# Patient Record
Sex: Female | Born: 1980 | Race: Black or African American | Hispanic: No | Marital: Single | Smoking: Never smoker
Health system: Southern US, Community
[De-identification: ages and names within clinical notes are randomized; demographics above are authoritative.]

## PROBLEM LIST (undated history)

## (undated) DIAGNOSIS — R519 Headache, unspecified: Secondary | ICD-10-CM

## (undated) DIAGNOSIS — Z8619 Personal history of other infectious and parasitic diseases: Secondary | ICD-10-CM

## (undated) DIAGNOSIS — A6000 Herpesviral infection of urogenital system, unspecified: Secondary | ICD-10-CM

## (undated) DIAGNOSIS — E221 Hyperprolactinemia: Secondary | ICD-10-CM

## (undated) DIAGNOSIS — R51 Headache: Secondary | ICD-10-CM

## (undated) HISTORY — DX: Herpesviral infection of urogenital system, unspecified: A60.00

## (undated) HISTORY — DX: Hyperprolactinemia: E22.1

## (undated) HISTORY — DX: Headache, unspecified: R51.9

## (undated) HISTORY — DX: Personal history of other infectious and parasitic diseases: Z86.19

## (undated) HISTORY — DX: Headache: R51

---
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
## 2002-02-05 ENCOUNTER — Other Ambulatory Visit: Admission: RE | Admit: 2002-02-05 | Discharge: 2002-02-05 | Payer: Self-pay | Admitting: Gynecology

## 2002-02-12 ENCOUNTER — Encounter: Payer: Self-pay | Admitting: Emergency Medicine

## 2002-02-12 ENCOUNTER — Emergency Department (HOSPITAL_COMMUNITY): Admission: EM | Admit: 2002-02-12 | Discharge: 2002-02-12 | Payer: Self-pay | Admitting: Emergency Medicine

## 2003-05-24 ENCOUNTER — Other Ambulatory Visit: Admission: RE | Admit: 2003-05-24 | Discharge: 2003-05-24 | Payer: Self-pay | Admitting: Internal Medicine

## 2003-08-17 ENCOUNTER — Encounter: Admission: RE | Admit: 2003-08-17 | Discharge: 2003-08-17 | Payer: Self-pay | Admitting: *Deleted

## 2003-09-14 ENCOUNTER — Encounter: Admission: RE | Admit: 2003-09-14 | Discharge: 2003-09-14 | Payer: Self-pay | Admitting: *Deleted

## 2003-09-14 ENCOUNTER — Ambulatory Visit (HOSPITAL_COMMUNITY): Admission: RE | Admit: 2003-09-14 | Discharge: 2003-09-14 | Payer: Self-pay | Admitting: *Deleted

## 2003-09-28 ENCOUNTER — Emergency Department (HOSPITAL_COMMUNITY): Admission: EM | Admit: 2003-09-28 | Discharge: 2003-09-28 | Payer: Self-pay | Admitting: Family Medicine

## 2003-10-12 ENCOUNTER — Encounter: Admission: RE | Admit: 2003-10-12 | Discharge: 2003-10-12 | Payer: Self-pay | Admitting: *Deleted

## 2003-11-09 ENCOUNTER — Ambulatory Visit: Payer: Self-pay | Admitting: *Deleted

## 2003-11-30 ENCOUNTER — Ambulatory Visit: Payer: Self-pay | Admitting: Family Medicine

## 2003-12-14 ENCOUNTER — Ambulatory Visit: Payer: Self-pay | Admitting: *Deleted

## 2003-12-28 ENCOUNTER — Ambulatory Visit: Payer: Self-pay | Admitting: Family Medicine

## 2004-01-11 ENCOUNTER — Ambulatory Visit: Payer: Self-pay | Admitting: *Deleted

## 2004-01-11 ENCOUNTER — Inpatient Hospital Stay (HOSPITAL_COMMUNITY): Admission: AD | Admit: 2004-01-11 | Discharge: 2004-01-11 | Payer: Self-pay | Admitting: Obstetrics & Gynecology

## 2004-01-14 ENCOUNTER — Ambulatory Visit: Payer: Self-pay | Admitting: *Deleted

## 2004-01-18 ENCOUNTER — Ambulatory Visit: Payer: Self-pay | Admitting: *Deleted

## 2004-01-18 ENCOUNTER — Ambulatory Visit (HOSPITAL_COMMUNITY): Admission: RE | Admit: 2004-01-18 | Discharge: 2004-01-18 | Payer: Self-pay | Admitting: *Deleted

## 2004-01-21 ENCOUNTER — Ambulatory Visit: Payer: Self-pay | Admitting: Obstetrics and Gynecology

## 2004-01-25 ENCOUNTER — Ambulatory Visit (HOSPITAL_COMMUNITY): Admission: RE | Admit: 2004-01-25 | Discharge: 2004-01-25 | Payer: Self-pay | Admitting: *Deleted

## 2004-01-25 ENCOUNTER — Ambulatory Visit: Payer: Self-pay | Admitting: *Deleted

## 2004-01-28 ENCOUNTER — Inpatient Hospital Stay (HOSPITAL_COMMUNITY): Admission: AD | Admit: 2004-01-28 | Discharge: 2004-01-28 | Payer: Self-pay | Admitting: *Deleted

## 2004-02-01 ENCOUNTER — Ambulatory Visit: Payer: Self-pay | Admitting: *Deleted

## 2004-02-04 ENCOUNTER — Ambulatory Visit: Payer: Self-pay | Admitting: *Deleted

## 2004-02-08 ENCOUNTER — Ambulatory Visit: Payer: Self-pay | Admitting: *Deleted

## 2004-02-11 ENCOUNTER — Ambulatory Visit: Payer: Self-pay | Admitting: Obstetrics & Gynecology

## 2004-02-15 ENCOUNTER — Inpatient Hospital Stay (HOSPITAL_COMMUNITY): Admission: AD | Admit: 2004-02-15 | Discharge: 2004-02-19 | Payer: Self-pay | Admitting: *Deleted

## 2004-02-15 ENCOUNTER — Ambulatory Visit: Payer: Self-pay | Admitting: *Deleted

## 2004-02-15 ENCOUNTER — Ambulatory Visit (HOSPITAL_COMMUNITY): Admission: RE | Admit: 2004-02-15 | Discharge: 2004-02-15 | Payer: Self-pay | Admitting: *Deleted

## 2004-02-17 ENCOUNTER — Ambulatory Visit: Payer: Self-pay | Admitting: *Deleted

## 2004-02-17 ENCOUNTER — Encounter (INDEPENDENT_AMBULATORY_CARE_PROVIDER_SITE_OTHER): Payer: Self-pay | Admitting: *Deleted

## 2005-10-26 ENCOUNTER — Emergency Department (HOSPITAL_COMMUNITY): Admission: EM | Admit: 2005-10-26 | Discharge: 2005-10-26 | Payer: Self-pay | Admitting: Family Medicine

## 2008-09-09 ENCOUNTER — Ambulatory Visit: Payer: Self-pay | Admitting: Gynecology

## 2008-09-09 ENCOUNTER — Other Ambulatory Visit: Admission: RE | Admit: 2008-09-09 | Discharge: 2008-09-09 | Payer: Self-pay | Admitting: Gynecology

## 2008-09-09 ENCOUNTER — Encounter: Payer: Self-pay | Admitting: Gynecology

## 2008-09-23 ENCOUNTER — Ambulatory Visit (HOSPITAL_COMMUNITY): Admission: RE | Admit: 2008-09-23 | Discharge: 2008-09-23 | Payer: Self-pay | Admitting: Gynecology

## 2008-10-13 ENCOUNTER — Ambulatory Visit: Payer: Self-pay | Admitting: Gynecology

## 2009-04-06 ENCOUNTER — Emergency Department (HOSPITAL_COMMUNITY): Admission: EM | Admit: 2009-04-06 | Discharge: 2009-04-07 | Payer: Self-pay | Admitting: Emergency Medicine

## 2009-04-08 ENCOUNTER — Ambulatory Visit: Payer: Self-pay | Admitting: Gynecology

## 2009-04-18 ENCOUNTER — Ambulatory Visit: Payer: Self-pay | Admitting: Gynecology

## 2010-03-26 ENCOUNTER — Encounter: Payer: Self-pay | Admitting: *Deleted

## 2010-05-24 LAB — COMPREHENSIVE METABOLIC PANEL
ALT: 12 U/L (ref 0–35)
AST: 20 U/L (ref 0–37)
Albumin: 3.4 g/dL — ABNORMAL LOW (ref 3.5–5.2)
Alkaline Phosphatase: 56 U/L (ref 39–117)
BUN: 5 mg/dL — ABNORMAL LOW (ref 6–23)
CO2: 27 mEq/L (ref 19–32)
Calcium: 8.7 mg/dL (ref 8.4–10.5)
Chloride: 100 mEq/L (ref 96–112)
Creatinine, Ser: 0.76 mg/dL (ref 0.4–1.2)
GFR calc Af Amer: >60 mL/min (ref >60)
GFR calc non Af Amer: >60 mL/min (ref >60)
Glucose, Bld: 114 mg/dL — ABNORMAL HIGH (ref 70–99)
Potassium: 3.6 mEq/L (ref 3.5–5.1)
Sodium: 134 mEq/L — ABNORMAL LOW (ref 135–145)
Total Bilirubin: 0.2 mg/dL — ABNORMAL LOW (ref 0.3–1.2)
Total Protein: 7.8 g/dL (ref 6.0–8.3)

## 2010-05-24 LAB — WET PREP, GENITAL
Clue Cells Wet Prep HPF POC: NONE SEEN
Trich, Wet Prep: NONE SEEN
Yeast Wet Prep HPF POC: NONE SEEN

## 2010-05-24 LAB — URINALYSIS, ROUTINE W REFLEX MICROSCOPIC
Bilirubin Urine: NEGATIVE
Glucose, UA: NEGATIVE mg/dL
Hgb urine dipstick: NEGATIVE
Ketones, ur: 15 mg/dL — AB
Nitrite: NEGATIVE
Protein, ur: 30 mg/dL — AB
Specific Gravity, Urine: 1.02 (ref 1.005–1.030)
Urobilinogen, UA: 1 mg/dL (ref 0.0–1.0)
pH: 7 (ref 5.0–8.0)

## 2010-05-24 LAB — CBC
HCT: 40 % (ref 36.0–46.0)
Hemoglobin: 13.2 g/dL (ref 12.0–15.0)
MCHC: 33 g/dL (ref 30.0–36.0)
MCV: 85 fL (ref 78.0–100.0)
Platelets: 222 10*3/uL (ref 150–400)
RBC: 4.71 MIL/uL (ref 3.87–5.11)
RDW: 13.9 % (ref 11.5–15.5)
WBC: 13.8 10*3/uL — ABNORMAL HIGH (ref 4.0–10.5)

## 2010-05-24 LAB — GC/CHLAMYDIA PROBE AMP, GENITAL
Chlamydia, DNA Probe: NEGATIVE
GC Probe Amp, Genital: NEGATIVE

## 2010-05-24 LAB — DIFFERENTIAL
Basophils Absolute: 0 10*3/uL (ref 0.0–0.1)
Basophils Relative: 0 % (ref 0–1)
Eosinophils Absolute: 0 10*3/uL (ref 0.0–0.7)
Eosinophils Relative: 0 % (ref 0–5)
Lymphocytes Relative: 4 % — ABNORMAL LOW (ref 12–46)
Lymphs Abs: 0.5 10*3/uL — ABNORMAL LOW (ref 0.7–4.0)
Monocytes Absolute: 0.7 10*3/uL (ref 0.1–1.0)
Monocytes Relative: 5 % (ref 3–12)
Neutro Abs: 12.5 10*3/uL — ABNORMAL HIGH (ref 1.7–7.7)
Neutrophils Relative %: 91 % — ABNORMAL HIGH (ref 43–77)

## 2010-05-24 LAB — URINE CULTURE
Colony Count: NO GROWTH
Culture: NO GROWTH

## 2010-05-24 LAB — URINE MICROSCOPIC-ADD ON

## 2010-05-24 LAB — MONONUCLEOSIS SCREEN: Mono Screen: NEGATIVE

## 2010-05-24 LAB — RAPID STREP SCREEN (MED CTR MEBANE ONLY): Streptococcus, Group A Screen (Direct): POSITIVE — AB

## 2010-05-24 LAB — LIPASE, BLOOD: Lipase: 36 U/L (ref 11–59)

## 2010-05-24 LAB — POCT PREGNANCY, URINE: Preg Test, Ur: NEGATIVE

## 2010-07-21 NOTE — Discharge Summary (Signed)
Katherine Pena, TOREN              ACCOUNT NO.:  [id number]   MEDICAL RECORD NO.:  [id number]          PATIENT TYPE:  INP   LOCATION:  9126                          FACILITY:  [facility]   PHYSICIAN:  Conni Elliot, M.D.DATE OF BIRTH:  1980/07/29   DATE OF ADMISSION:  02/15/2004  DATE OF DISCHARGE:                                 DISCHARGE SUMMARY   DISCHARGE DIAGNOSES:  1.  Term pregnancy, delivered.  2.  Induction of labor for oligohydramnios.  3.  Normal spontaneous vaginal delivery of a female infant.   DISCHARGE MEDICATIONS:  1.  Prenatal vitamins one p.o. daily.  2.  Ibuprofen 600 mg one p.o. q.6h. p.r.n. for pain.   HOSPITAL COURSE:  This 30 year old G1 P0 presented at [redacted] weeks gestational  age, sent for induction for oligohydramnios.  Ultrasound on February 15, 2004 revealed a vertex baby with a grade 3 placenta, AFI of 9.5, mean  gestational age approximately 36 weeks 1 day, best estimated fetal weight of  2784 g.  Fetal Dopplers were within normal limits.  The patient was admitted  for induction, given Cervidil for cervical ripening which was removed 12  hours later.  She was started on low-dose Pitocin, given penicillin for GBS  prophylaxis.  On day #3 of induction, the patient delivered a viable female  by normal spontaneous vaginal delivery with Apgars 9 at one minute and 9 at  five minutes under epidural anesthesia.  First degree laceration was  repaired with 3-0 Vicryl, estimated blood loss less than 500 mL.  The  patient and infant were stable.  The patient had a routine postpartum  course, is breastfeeding and bottle feeding, undecided on contraception but  will decide at her 6-week follow-up.   DISCHARGE LABORATORY DATA:  Blood type A positive, antibody negative.  GBS  positive.  Rubella immune.  Hematocrit 12.8.   FOLLOW-UP ITEMS:  Follow up at [facility] in 6 weeks for a postpartum  visit as well as contraception counseling.      TH/MEDQ  D:   02/19/2004  T:  02/19/2004  Job:  [id number]

## 2010-08-22 ENCOUNTER — Encounter: Payer: Self-pay | Admitting: Gynecology

## 2010-11-10 ENCOUNTER — Emergency Department (HOSPITAL_COMMUNITY)
Admission: EM | Admit: 2010-11-10 | Discharge: 2010-11-10 | Disposition: A | Discharge status: Home / Self Care | Payer: BC Managed Care – PPO | Attending: Emergency Medicine | Admitting: Emergency Medicine

## 2010-11-10 ENCOUNTER — Emergency Department (HOSPITAL_COMMUNITY): Payer: BC Managed Care – PPO

## 2010-11-10 DIAGNOSIS — M542 Cervicalgia: Secondary | ICD-10-CM | POA: Insufficient documentation

## 2010-11-10 DIAGNOSIS — G44209 Tension-type headache, unspecified, not intractable: Secondary | ICD-10-CM | POA: Insufficient documentation

## 2010-11-10 DIAGNOSIS — R209 Unspecified disturbances of skin sensation: Secondary | ICD-10-CM | POA: Insufficient documentation

## 2010-11-23 ENCOUNTER — Encounter: Payer: Self-pay | Admitting: Gynecology

## 2011-02-08 ENCOUNTER — Ambulatory Visit (INDEPENDENT_AMBULATORY_CARE_PROVIDER_SITE_OTHER): Payer: BC Managed Care – PPO | Admitting: Gynecology

## 2011-02-08 ENCOUNTER — Encounter: Payer: Self-pay | Admitting: Gynecology

## 2011-02-08 VITALS — BP 124/76 | Ht 65.5 in | Wt 160.0 lb

## 2011-02-08 DIAGNOSIS — N912 Amenorrhea, unspecified: Secondary | ICD-10-CM

## 2011-02-08 NOTE — Patient Instructions (Signed)
Establish care with an obstetrician in [place] for ongoing obstetrical care within the next 2-3 weeks.

## 2011-02-08 NOTE — Progress Notes (Signed)
Patient presents approximately 6 weeks with positive home pregnancy test. No bleeding no cramping or other complaints. She is on multivitamin with folate acid.  Exam which chaperone present Abdomen soft nontender without masses guarding rebound organomegaly Pelvic external BUS vagina normal, cervix normal, uterus 6 weeks size soft anteverted adnexa without masses or tenderness  Assessment and plan: IUP approximately [redacted] weeks pregnant. Patient understands that we do not do obstetrics and she will need to establish care with an obstetrician in town sometime over the next several weeks. I discussed the availability of prenatal diagnostic techniques and that she needs to establish early prenatal care. She'll continue on her multi-vitamin with folic acid.

## 2011-02-09 ENCOUNTER — Encounter: Payer: BC Managed Care – PPO | Admitting: Gynecology

## 2011-02-22 ENCOUNTER — Other Ambulatory Visit: Payer: Self-pay | Admitting: Obstetrics and Gynecology

## 2011-02-22 ENCOUNTER — Ambulatory Visit (INDEPENDENT_AMBULATORY_CARE_PROVIDER_SITE_OTHER): Payer: BC Managed Care – PPO | Admitting: *Deleted

## 2011-02-22 DIAGNOSIS — Z23 Encounter for immunization: Secondary | ICD-10-CM

## 2011-02-22 LAB — OB RESULTS CONSOLE RPR: RPR: NONREACTIVE

## 2011-02-22 LAB — OB RESULTS CONSOLE HEPATITIS B SURFACE ANTIGEN: Hepatitis B Surface Ag: NEGATIVE

## 2011-02-22 LAB — OB RESULTS CONSOLE GC/CHLAMYDIA: Chlamydia: NEGATIVE

## 2011-02-22 LAB — OB RESULTS CONSOLE HIV ANTIBODY (ROUTINE TESTING): HIV: NONREACTIVE

## 2011-02-22 LAB — OB RESULTS CONSOLE RUBELLA ANTIBODY, IGM: Rubella: IMMUNE

## 2011-03-06 NOTE — L&D Delivery Note (Signed)
Operative Delivery Note At 2:13 PM a viable and healthy female was delivered via Vaginal, Spontaneous Delivery.  Presentation: vertex; Position: Left,, Occiput,, Anterior; Station: +3.  Verbal consent: obtained from patient.  Risks and benefits discussed in detail.  Risks include, but are not limited to the risks of anesthesia, bleeding, infection, damage to maternal tissues, fetal cephalhematoma.  There is also the risk of inability to effect vaginal delivery of the head, or shoulder dystocia that cannot be resolved by established maneuvers, leading to the need for emergency cesarean section.  I was called for delivery. When I arrived in the room the patient was on her right side with oxygen in place.  Fetal heart rate was 80s and had been down for 5 minutes.  I advised the patient of the low heart rate and the need to expedite delivery with the assistance of the vacuum.  The vertex was at +2/+3 station.  The vacuum was applied and with one push the vertex was delivered to crowning.  The vacuum was disengaged and the body was delivered.  A nuchal cord was noted x 1.  The infant cried vigorously after delivery.  The placenta delivered spontaneously, intact with 3V cord.  A 2nd degree laceration was repaired with 3-0 vicryl.  Mother and baby doing well after delivery.  APGAR: , ; weight .   Placenta status: , .   Cord: 3 vessels with a nuchal cord x 1  Anesthesia: Epidural  Instruments: kiwi vaccuum Episiotomy: none  Lacerations: 2nd degree laceration Suture Repair: 3.0 vicryl Est. Blood Loss (mL): 350  Mom to postpartum.  Baby to nursery-stable.  Kayode Petion H. 10/08/2011, 2:31 PM

## 2011-10-05 ENCOUNTER — Telehealth (HOSPITAL_COMMUNITY): Payer: Self-pay | Admitting: *Deleted

## 2011-10-05 ENCOUNTER — Encounter (HOSPITAL_COMMUNITY): Payer: Self-pay | Admitting: *Deleted

## 2011-10-05 NOTE — Telephone Encounter (Signed)
Preadmission screen  

## 2011-10-07 ENCOUNTER — Inpatient Hospital Stay (HOSPITAL_COMMUNITY)
Admission: AD | Admit: 2011-10-07 | Discharge: 2011-10-10 | DRG: 373 | Disposition: A | Discharge status: Home / Self Care | Payer: BC Managed Care – PPO | Source: Ambulatory Visit | Attending: Obstetrics & Gynecology | Admitting: Obstetrics & Gynecology

## 2011-10-07 NOTE — MAU Note (Signed)
Contractions since 1700. Denies SROM or vaginal bleeding.

## 2011-10-08 ENCOUNTER — Encounter (HOSPITAL_COMMUNITY): Payer: Self-pay | Admitting: Anesthesiology

## 2011-10-08 ENCOUNTER — Encounter (HOSPITAL_COMMUNITY): Payer: Self-pay | Admitting: Family Medicine

## 2011-10-08 ENCOUNTER — Encounter (HOSPITAL_COMMUNITY): Payer: Self-pay | Admitting: *Deleted

## 2011-10-08 ENCOUNTER — Inpatient Hospital Stay (HOSPITAL_COMMUNITY): Payer: BC Managed Care – PPO | Admitting: Anesthesiology

## 2011-10-08 LAB — CBC
MCH: 24.4 pg — ABNORMAL LOW (ref 26.0–34.0)
MCHC: 32.3 g/dL (ref 30.0–36.0)
MCV: 75.4 fL — ABNORMAL LOW (ref 78.0–100.0)
Platelets: 210 10*3/uL (ref 150–400)
RBC: 4.84 MIL/uL (ref 3.87–5.11)
RDW: 16.7 % — ABNORMAL HIGH (ref 11.5–15.5)

## 2011-10-08 LAB — RPR: RPR Ser Ql: NONREACTIVE

## 2011-10-08 MED ORDER — ONDANSETRON HCL 4 MG/2ML IJ SOLN: 4.0000 mg | INTRAMUSCULAR | Status: DC | PRN

## 2011-10-08 MED ORDER — SENNOSIDES-DOCUSATE SODIUM 8.6-50 MG PO TABS
2.0000 | ORAL_TABLET | Freq: Every day | ORAL | Status: DC
  Administered 2011-10-09: 2 via ORAL

## 2011-10-08 MED ORDER — OXYCODONE-ACETAMINOPHEN 5-325 MG PO TABS: 1.0000 | ORAL_TABLET | ORAL | Status: DC | PRN

## 2011-10-08 MED ORDER — PHENYLEPHRINE 40 MCG/ML (10ML) SYRINGE FOR IV PUSH (FOR BLOOD PRESSURE SUPPORT)
80.0000 ug | PREFILLED_SYRINGE | INTRAVENOUS | Status: DC | PRN
  Filled 2011-10-08: qty 5

## 2011-10-08 MED ORDER — OXYTOCIN 40 UNITS IN LACTATED RINGERS INFUSION - SIMPLE MED
62.5000 mL/h | Freq: Once | INTRAVENOUS | Status: AC
  Administered 2011-10-08: 999 mL/h via INTRAVENOUS
  Filled 2011-10-08: qty 1000

## 2011-10-08 MED ORDER — EPHEDRINE 5 MG/ML INJ
10.0000 mg | INTRAVENOUS | Status: DC | PRN
  Filled 2011-10-08: qty 4

## 2011-10-08 MED ORDER — FENTANYL 2.5 MCG/ML BUPIVACAINE 1/10 % EPIDURAL INFUSION (WH - ANES)
14.0000 mL/h | INTRAMUSCULAR | Status: DC
  Administered 2011-10-08: 14 mL/h via EPIDURAL
  Filled 2011-10-08 (×3): qty 60

## 2011-10-08 MED ORDER — LACTATED RINGERS IV SOLN
INTRAVENOUS | Status: DC
  Administered 2011-10-08 (×4): via INTRAVENOUS

## 2011-10-08 MED ORDER — DIPHENHYDRAMINE HCL 25 MG PO CAPS: 25.0000 mg | ORAL_CAPSULE | Freq: Four times a day (QID) | ORAL | Status: DC | PRN

## 2011-10-08 MED ORDER — METHYLERGONOVINE MALEATE 0.2 MG PO TABS: 0.2000 mg | ORAL_TABLET | ORAL | Status: DC | PRN

## 2011-10-08 MED ORDER — IBUPROFEN 600 MG PO TABS: 600.0000 mg | ORAL_TABLET | Freq: Four times a day (QID) | ORAL | Status: DC | PRN

## 2011-10-08 MED ORDER — SIMETHICONE 80 MG PO CHEW: 80.0000 mg | CHEWABLE_TABLET | ORAL | Status: DC | PRN

## 2011-10-08 MED ORDER — PRENATAL MULTIVITAMIN CH
1.0000 | ORAL_TABLET | Freq: Every day | ORAL | Status: DC
  Administered 2011-10-09 – 2011-10-10 (×2): 1 via ORAL
  Filled 2011-10-08 (×2): qty 1

## 2011-10-08 MED ORDER — OXYTOCIN 40 UNITS IN LACTATED RINGERS INFUSION - SIMPLE MED
1.0000 m[IU]/min | INTRAVENOUS | Status: DC
  Administered 2011-10-08: 2 m[IU]/min via INTRAVENOUS

## 2011-10-08 MED ORDER — FENTANYL 2.5 MCG/ML BUPIVACAINE 1/10 % EPIDURAL INFUSION (WH - ANES)
INTRAMUSCULAR | Status: DC | PRN
  Administered 2011-10-08: 14 mL/h via EPIDURAL

## 2011-10-08 MED ORDER — TETANUS-DIPHTH-ACELL PERTUSSIS 5-2.5-18.5 LF-MCG/0.5 IM SUSP
0.5000 mL | Freq: Once | INTRAMUSCULAR | Status: AC
  Administered 2011-10-09: 0.5 mL via INTRAMUSCULAR
  Filled 2011-10-08 (×2): qty 0.5

## 2011-10-08 MED ORDER — OXYTOCIN BOLUS FROM INFUSION
250.0000 mL | Freq: Once | INTRAVENOUS | Status: DC
  Filled 2011-10-08: qty 500

## 2011-10-08 MED ORDER — FLEET ENEMA 7-19 GM/118ML RE ENEM: 1.0000 | ENEMA | RECTAL | Status: DC | PRN

## 2011-10-08 MED ORDER — LIDOCAINE HCL (PF) 1 % IJ SOLN
INTRAMUSCULAR | Status: DC | PRN
  Administered 2011-10-08 (×2): 4 mL

## 2011-10-08 MED ORDER — ZOLPIDEM TARTRATE 5 MG PO TABS: 5.0000 mg | ORAL_TABLET | Freq: Every evening | ORAL | Status: DC | PRN

## 2011-10-08 MED ORDER — WITCH HAZEL-GLYCERIN EX PADS
1.0000 "application " | MEDICATED_PAD | CUTANEOUS | Status: DC | PRN
  Administered 2011-10-09: 1 via TOPICAL

## 2011-10-08 MED ORDER — IBUPROFEN 600 MG PO TABS
600.0000 mg | ORAL_TABLET | Freq: Four times a day (QID) | ORAL | Status: DC
  Administered 2011-10-08 – 2011-10-10 (×7): 600 mg via ORAL
  Filled 2011-10-08 (×8): qty 1

## 2011-10-08 MED ORDER — DIBUCAINE 1 % RE OINT
1.0000 "application " | TOPICAL_OINTMENT | RECTAL | Status: DC | PRN
  Administered 2011-10-09: 1 via RECTAL
  Filled 2011-10-08 (×2): qty 28

## 2011-10-08 MED ORDER — TERBUTALINE SULFATE 1 MG/ML IJ SOLN: 0.2500 mg | Freq: Once | INTRAMUSCULAR | Status: DC | PRN

## 2011-10-08 MED ORDER — BUTORPHANOL TARTRATE 1 MG/ML IJ SOLN
1.0000 mg | INTRAMUSCULAR | Status: DC | PRN
  Administered 2011-10-08 (×4): 1 mg via INTRAVENOUS
  Filled 2011-10-08 (×4): qty 1

## 2011-10-08 MED ORDER — LANOLIN HYDROUS EX OINT: TOPICAL_OINTMENT | CUTANEOUS | Status: DC | PRN

## 2011-10-08 MED ORDER — ONDANSETRON HCL 4 MG PO TABS: 4.0000 mg | ORAL_TABLET | ORAL | Status: DC | PRN

## 2011-10-08 MED ORDER — LACTATED RINGERS IV SOLN: 500.0000 mL | Freq: Once | INTRAVENOUS | Status: DC

## 2011-10-08 MED ORDER — EPHEDRINE 5 MG/ML INJ: 10.0000 mg | INTRAVENOUS | Status: DC | PRN

## 2011-10-08 MED ORDER — PHENYLEPHRINE 40 MCG/ML (10ML) SYRINGE FOR IV PUSH (FOR BLOOD PRESSURE SUPPORT): 80.0000 ug | PREFILLED_SYRINGE | INTRAVENOUS | Status: DC | PRN

## 2011-10-08 MED ORDER — ACETAMINOPHEN 325 MG PO TABS: 650.0000 mg | ORAL_TABLET | ORAL | Status: DC | PRN

## 2011-10-08 MED ORDER — ONDANSETRON HCL 4 MG/2ML IJ SOLN: 4.0000 mg | Freq: Four times a day (QID) | INTRAMUSCULAR | Status: DC | PRN

## 2011-10-08 MED ORDER — BENZOCAINE-MENTHOL 20-0.5 % EX AERO
1.0000 "application " | INHALATION_SPRAY | CUTANEOUS | Status: DC | PRN
  Administered 2011-10-08: 1 via TOPICAL
  Filled 2011-10-08 (×2): qty 56

## 2011-10-08 MED ORDER — LACTATED RINGERS IV SOLN: 500.0000 mL | INTRAVENOUS | Status: DC | PRN

## 2011-10-08 MED ORDER — CITRIC ACID-SODIUM CITRATE 334-500 MG/5ML PO SOLN: 30.0000 mL | ORAL | Status: DC | PRN

## 2011-10-08 MED ORDER — DIPHENHYDRAMINE HCL 50 MG/ML IJ SOLN: 12.5000 mg | INTRAMUSCULAR | Status: DC | PRN

## 2011-10-08 MED ORDER — METHYLERGONOVINE MALEATE 0.2 MG/ML IJ SOLN: 0.2000 mg | INTRAMUSCULAR | Status: DC | PRN

## 2011-10-08 MED ORDER — LIDOCAINE HCL (PF) 1 % IJ SOLN
30.0000 mL | INTRAMUSCULAR | Status: DC | PRN
  Filled 2011-10-08: qty 30

## 2011-10-08 MED ORDER — OXYCODONE-ACETAMINOPHEN 5-325 MG PO TABS
1.0000 | ORAL_TABLET | ORAL | Status: DC | PRN
  Administered 2011-10-08: 1 via ORAL
  Filled 2011-10-08: qty 1

## 2011-10-08 NOTE — Anesthesia Procedure Notes (Signed)
Epidural Patient location during procedure: OB Start time: 10/08/2011 9:11 AM  Staffing Anesthesiologist: Kristapher [place] A. Performed by: anesthesiologist   Preanesthetic Checklist Completed: patient identified, site marked, surgical consent, pre-op evaluation, timeout performed, IV checked, risks and benefits discussed and monitors and equipment checked  Epidural Patient position: sitting Prep: site prepped and draped and DuraPrep Patient monitoring: continuous pulse ox and blood pressure Approach: midline Injection technique: LOR air  Needle:  Needle type: Tuohy  Needle gauge: 17 G Needle length: 9 cm Needle insertion depth: 5 cm cm Catheter type: closed end flexible Catheter size: 19 Gauge Catheter at skin depth: 10 cm Test dose: negative and Other  Assessment Events: blood not aspirated, injection not painful, no injection resistance, negative IV test and no paresthesia  Additional Notes Patient identified. Risks and benefits discussed including failed block, incomplete  Pain control, post dural puncture headache, nerve damage, paralysis, blood pressure Changes, nausea, vomiting, reactions to medications-both toxic and allergic and post Partum back pain. All questions were answered. Patient expressed understanding and wished to proceed. Sterile technique was used throughout procedure. Epidural site was Dressed with sterile barrier dressing. No paresthesias, signs of intravascular injection Or signs of intrathecal spread were encountered.  Patient was more comfortable after the epidural was dosed. Please see RN's note for documentation of vital signs and FHR which are stable.

## 2011-10-08 NOTE — Anesthesia Preprocedure Evaluation (Signed)
Anesthesia Evaluation  Patient identified by MRN, date of birth, ID band Patient awake    Reviewed: Allergy & Precautions, H&P , Patient's Chart, lab work & pertinent test results  Airway Mallampati: III TM Distance: >3 FB Neck ROM: full    Dental No notable dental hx. (+) Teeth Intact   Pulmonary neg pulmonary ROS,  breath sounds clear to auscultation  Pulmonary exam normal       Cardiovascular negative cardio ROS  Rhythm:regular Rate:Normal     Neuro/Psych negative neurological ROS  negative psych ROS   GI/Hepatic negative GI ROS, Neg liver ROS,   Endo/Other  negative endocrine ROS  Renal/GU negative Renal ROS  negative genitourinary   Musculoskeletal   Abdominal Normal abdominal exam  (+)   Peds  Hematology  (+) [facility]  Anesthesia Other Findings   Reproductive/Obstetrics (+) Pregnancy                           Anesthesia Physical Anesthesia Plan  ASA: II  Anesthesia Plan: Epidural   Post-op Pain Management:    Induction:   Airway Management Planned:   Additional Equipment:   Intra-op Plan:   Post-operative Plan:   Informed Consent: I have reviewed the patients History and Physical, chart, labs and discussed the procedure including the risks, benefits and alternatives for the proposed anesthesia with the patient or authorized representative who has indicated his/her understanding and acceptance.     Plan Discussed with: Anesthesiologist  Anesthesia Plan Comments:         Anesthesia Quick Evaluation

## 2011-10-08 NOTE — H&P (Signed)
Katherine Pena is a 31 y.o. female presenting for labor Patients pregnancy has been uncomplicated up to this point.  She presents to MAU for painful contractions. 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 History OB History    Grav Para Term Preterm Abortions TAB SAB Ect Mult Living   2 1 1   0 0    1     Past Medical History  Diagnosis Date  . H/O varicella    History reviewed. No pertinent past surgical history. Family History: family history is negative for Other. Social History:  reports that she has never smoked. She has never used smokeless tobacco. She reports that she does not drink alcohol or use illicit drugs.   Prenatal Transfer Tool  Maternal Diabetes: No Genetic Screening: Declined Maternal Ultrasounds/Referrals: Normal Fetal Ultrasounds or other Referrals:  None Maternal Substance Abuse:  No Significant Maternal Medications:  None Significant Maternal Lab Results:  None Other Comments:  None  ROS: as above  Dilation: 7.5 Effacement (%): 80 Station: -1 Exam by:: Clearence Cheek RN Blood pressure 106/62, pulse 101, temperature 98.7 F (37.1 C), temperature source Oral, resp. rate 20, height 5\' 3"  (1.6 m), weight 77.565 kg (171 lb), last menstrual period 12/28/2010, SpO2 99.00%. Exam Physical Exam   AOX3, uncomfortable Gravid soft FHT 150 reactive with accels toco Q2-5 minutes with coupling  Prenatal labs: ABO, Rh: A/Positive/-- (12/20 0000) Antibody: Negative (12/20 0000) Rubella: Immune (12/20 0000) RPR: Nonreactive (12/20 0000)  HBsAg: Negative (12/20 0000)  HIV: Non-reactive (12/20 0000)  GBS: Negative (07/10 0000)   Assessment/Plan: 31 yo G2P1001 at 40+4 in labor 1) admit 2) FWB reassuring 3) Cervix has been 6-7 for several hours.  The patient was originally refusing epidural and only wanted IV pain management. Now stadol not relieving pain.  Discussed lack of cervical change and need for augmentation.  Discussed pain management  options and encouraged patient to consider epidural.  After careful consideration the patient wishes to proceed with epidural.  Katherine Pena H. 10/08/2011, 9:37 AM

## 2011-10-08 NOTE — H&P (Signed)
31 y.o. G2P1001  Estimated Date of Delivery: 10/04/11 admitted at 40/[redacted] wks gestation in labor.  Prenatal Transfer Tool  Maternal Diabetes: No Genetic Screening: Declined Maternal Ultrasounds/Referrals: Normal Fetal Ultrasounds or other Referrals:  None Maternal Substance Abuse:  No Significant Maternal Medications:  None Significant Maternal Lab Results: None Other Significant Pregnancy Complications:  none  Afebrile, VSS Heart and Lungs: No active disease Abdomen: soft, gravid, EFW AGA. Cervical exam: 6/70  Impression: Labor  Plan:  Admit

## 2011-10-09 LAB — CBC
HCT: 30.7 % — ABNORMAL LOW (ref 36.0–46.0)
Hemoglobin: 9.8 g/dL — ABNORMAL LOW (ref 12.0–15.0)
MCH: 24.3 pg — ABNORMAL LOW (ref 26.0–34.0)
MCV: 76.2 fL — ABNORMAL LOW (ref 78.0–100.0)
Platelets: 189 10*3/uL (ref 150–400)
RBC: 4.03 MIL/uL (ref 3.87–5.11)

## 2011-10-09 NOTE — Progress Notes (Signed)
PPD#1  Pt without c/o. Plans to have circ in office. VSSAF IMP/doing well. Plan/ routine care.

## 2011-10-09 NOTE — Anesthesia Postprocedure Evaluation (Signed)
  Anesthesia Post-op Note  Patient: Katherine Pena  Procedure(s) Performed: * No procedures listed *  Patient Location: Mother/Baby  Anesthesia Type: Epidural  Level of Consciousness: awake  Airway and Oxygen Therapy: Patient Spontanous Breathing  Post-op Pain: none  Post-op Assessment: Patient's Cardiovascular Status Stable, Respiratory Function Stable, Patent Airway, No signs of Nausea or vomiting, Adequate PO intake, Pain level controlled, No headache, No backache, No residual numbness and No residual motor weakness  Post-op Vital Signs: Reviewed and stable  Complications: No apparent anesthesia complications

## 2011-10-10 NOTE — Progress Notes (Signed)
Post discharge review completed. 

## 2011-10-10 NOTE — Discharge Summary (Signed)
Obstetric Discharge Summary Reason for Admission: onset of labor Prenatal Procedures: none Intrapartum Procedures: vacuum Postpartum Procedures: none Complications-Operative and Postpartum: 2 degree perineal laceration Hemoglobin  Date Value Range Status  10/09/2011 9.8* 12.0 - 15.0 g/dL Final     DELTA CHECK NOTED     REPEATED TO VERIFY     HCT  Date Value Range Status  10/09/2011 30.7* 36.0 - 46.0 % Final    Discharge Diagnoses: Term Pregnancy-delivered  Discharge Information: Date: 10/10/2011 Activity: pelvic rest Diet: routine Medications: Ibuprofen and Iron Condition: stable Instructions: refer to practice specific booklet Discharge to: home Follow-up Information    Follow up with Almon Hercules., MD. Schedule an appointment as soon as possible for a visit in 4 weeks.   Contact information:   [address] [place] [address] [phone]          Newborn Data: Live born female  Birth Weight: 8 lb 0.2 oz (3634 g) APGAR: 9, 9  Home with mother.  Katherine Pena A 10/10/2011, 8:37 AM

## 2011-10-10 NOTE — Progress Notes (Signed)
Patient is eating, ambulating, voiding.  Pain control is good.  Filed Vitals:   10/08/11 2145 10/09/11 0545 10/09/11 2135 10/10/11 0539  BP: 116/72 99/65 102/66 118/79  Pulse: 88 91 87 96  Temp: 98.2 F (36.8 C) 97.7 F (36.5 C) 98.1 F (36.7 C) 98.4 F (36.9 C)  TempSrc:  Oral Oral Oral  Resp: 20 18 18 18   Height:      Weight:      SpO2:        Fundus firm Perineum without swelling.  Lab Results  Component Value Date   WBC 20.8* 10/09/2011   HGB 9.8* 10/09/2011   HCT 30.7* 10/09/2011   MCV 76.2* 10/09/2011   PLT 189 10/09/2011    A/Positive/-- (12/20 0000)/RI  A/P Post partum day 2.  Routine care.  Expect d/c today.    Chikita Dogan A

## 2011-10-12 ENCOUNTER — Inpatient Hospital Stay (HOSPITAL_COMMUNITY): Admission: RE | Admit: 2011-10-12 | Payer: BC Managed Care – PPO | Source: Ambulatory Visit

## 2014-01-04 ENCOUNTER — Encounter (HOSPITAL_COMMUNITY): Payer: Self-pay | Admitting: *Deleted

## 2014-03-05 DIAGNOSIS — A6000 Herpesviral infection of urogenital system, unspecified: Secondary | ICD-10-CM

## 2014-03-05 HISTORY — DX: Herpesviral infection of urogenital system, unspecified: A60.00

## 2014-04-23 ENCOUNTER — Other Ambulatory Visit (HOSPITAL_COMMUNITY)
Admission: RE | Admit: 2014-04-23 | Discharge: 2014-04-23 | Disposition: A | Discharge status: Home / Self Care | Payer: BC Managed Care – PPO | Source: Ambulatory Visit | Attending: Gynecology | Admitting: Gynecology

## 2014-04-23 ENCOUNTER — Encounter: Payer: Self-pay | Admitting: Gynecology

## 2014-04-23 ENCOUNTER — Ambulatory Visit (INDEPENDENT_AMBULATORY_CARE_PROVIDER_SITE_OTHER): Payer: BC Managed Care – PPO | Admitting: Gynecology

## 2014-04-23 VITALS — BP 124/80 | Ht 66.0 in | Wt 187.0 lb

## 2014-04-23 DIAGNOSIS — Z01419 Encounter for gynecological examination (general) (routine) without abnormal findings: Secondary | ICD-10-CM | POA: Insufficient documentation

## 2014-04-23 DIAGNOSIS — Z1151 Encounter for screening for human papillomavirus (HPV): Secondary | ICD-10-CM | POA: Diagnosis present

## 2014-04-23 DIAGNOSIS — E221 Hyperprolactinemia: Secondary | ICD-10-CM

## 2014-04-23 DIAGNOSIS — D352 Benign neoplasm of pituitary gland: Secondary | ICD-10-CM

## 2014-04-23 DIAGNOSIS — Z3009 Encounter for other general counseling and advice on contraception: Secondary | ICD-10-CM

## 2014-04-23 LAB — CBC WITH DIFFERENTIAL/PLATELET
BASOS ABS: 0 10*3/uL (ref 0.0–0.1)
Basophils Relative: 0 % (ref 0–1)
EOS ABS: 0 10*3/uL (ref 0.0–0.7)
Eosinophils Relative: 0 % (ref 0–5)
HEMATOCRIT: 37.4 % (ref 36.0–46.0)
Hemoglobin: 11.4 g/dL — ABNORMAL LOW (ref 12.0–15.0)
Lymphocytes Relative: 24 % (ref 12–46)
Lymphs Abs: 2.1 10*3/uL (ref 0.7–4.0)
MCH: 24.2 pg — ABNORMAL LOW (ref 26.0–34.0)
MCHC: 30.5 g/dL (ref 30.0–36.0)
MCV: 79.4 fL (ref 78.0–100.0)
MONO ABS: 0.6 10*3/uL (ref 0.1–1.0)
MONOS PCT: 7 % (ref 3–12)
MPV: 10.4 fL (ref 8.6–12.4)
NEUTROS ABS: 5.9 10*3/uL (ref 1.7–7.7)
NEUTROS PCT: 69 % (ref 43–77)
PLATELETS: 334 10*3/uL (ref 150–400)
RBC: 4.71 MIL/uL (ref 3.87–5.11)
RDW: 15.7 % — AB (ref 11.5–15.5)
WBC: 8.6 10*3/uL (ref 4.0–10.5)

## 2014-04-23 LAB — LIPID PANEL
CHOL/HDL RATIO: 3.2 ratio
Cholesterol: 145 mg/dL (ref 0–200)
HDL: 45 mg/dL (ref >39)
LDL CALC: 90 mg/dL (ref 0–99)
Triglycerides: 49 mg/dL (ref <150)
VLDL: 10 mg/dL (ref 0–40)

## 2014-04-23 LAB — COMPREHENSIVE METABOLIC PANEL
ALT: 10 U/L (ref 0–35)
AST: 12 U/L (ref 0–37)
Albumin: 4.1 g/dL (ref 3.5–5.2)
Alkaline Phosphatase: 73 U/L (ref 39–117)
BUN: 9 mg/dL (ref 6–23)
CALCIUM: 9.6 mg/dL (ref 8.4–10.5)
CHLORIDE: 103 meq/L (ref 96–112)
CO2: 26 meq/L (ref 19–32)
CREATININE: 0.67 mg/dL (ref 0.50–1.10)
GLUCOSE: 110 mg/dL — AB (ref 70–99)
Potassium: 3.7 mEq/L (ref 3.5–5.3)
Sodium: 138 mEq/L (ref 135–145)
Total Bilirubin: 0.5 mg/dL (ref 0.2–1.2)
Total Protein: 7.8 g/dL (ref 6.0–8.3)

## 2014-04-23 NOTE — Addendum Note (Signed)
Addended by: Nelva Nay on: 04/23/2014 12:46 PM   Modules accepted: Orders

## 2014-04-23 NOTE — Patient Instructions (Signed)
Office will call you to arrange the MRI of the pituitary.  Follow up with your decision as far as contraception.  You may obtain a copy of any labs that were done today by logging onto MyChart as outlined in the instructions provided with your AVS (after visit summary). The office will not call with normal lab results but certainly if there are any significant abnormalities then we will contact you.   Health Maintenance, Female A healthy lifestyle and preventative care can promote health and wellness.  Maintain regular health, dental, and eye exams.  Eat a healthy diet. Foods like vegetables, fruits, whole grains, low-fat dairy products, and lean protein foods contain the nutrients you need without too many calories. Decrease your intake of foods high in solid fats, added sugars, and salt. Get information about a proper diet from your caregiver, if necessary.  Regular physical exercise is one of the most important things you can do for your health. Most adults should get at least 150 minutes of moderate-intensity exercise (any activity that increases your heart rate and causes you to sweat) each week. In addition, most adults need muscle-strengthening exercises on 2 or more days a week.   Maintain a healthy weight. The body mass index (BMI) is a screening tool to identify possible weight problems. It provides an estimate of body fat based on height and weight. Your caregiver can help determine your BMI, and can help you achieve or maintain a healthy weight. For adults 20 years and older:  A BMI below 18.5 is considered underweight.  A BMI of 18.5 to 24.9 is normal.  A BMI of 25 to 29.9 is considered overweight.  A BMI of 30 and above is considered obese.  Maintain normal blood lipids and cholesterol by exercising and minimizing your intake of saturated fat. Eat a balanced diet with plenty of fruits and vegetables. Blood tests for lipids and cholesterol should begin at age 75 and be repeated  every 5 years. If your lipid or cholesterol levels are high, you are over 50, or you are a high risk for heart disease, you may need your cholesterol levels checked more frequently.Ongoing high lipid and cholesterol levels should be treated with medicines if diet and exercise are not effective.  If you smoke, find out from your caregiver how to quit. If you do not use tobacco, do not start.  Lung cancer screening is recommended for adults aged 96 80 years who are at high risk for developing lung cancer because of a history of smoking. Yearly low-dose computed tomography (CT) is recommended for people who have at least a 30-pack-year history of smoking and are a current smoker or have quit within the past 15 years. A pack year of smoking is smoking an average of 1 pack of cigarettes a day for 1 year (for example: 1 pack a day for 30 years or 2 packs a day for 15 years). Yearly screening should continue until the smoker has stopped smoking for at least 15 years. Yearly screening should also be stopped for people who develop a health problem that would prevent them from having lung cancer treatment.  If you are pregnant, do not drink alcohol. If you are breastfeeding, be very cautious about drinking alcohol. If you are not pregnant and choose to drink alcohol, do not exceed 1 drink per day. One drink is considered to be 12 ounces (355 mL) of beer, 5 ounces (148 mL) of wine, or 1.5 ounces (44 mL) of liquor.  Avoid use of street drugs. Do not share needles with anyone. Ask for help if you need support or instructions about stopping the use of drugs.  High blood pressure causes heart disease and increases the risk of stroke. Blood pressure should be checked at least every 1 to 2 years. Ongoing high blood pressure should be treated with medicines, if weight loss and exercise are not effective.  If you are 44 to 35 years old, ask your caregiver if you should take aspirin to prevent strokes.  Diabetes  screening involves taking a blood sample to check your fasting blood sugar level. This should be done once every 3 years, after age 54, if you are within normal weight and without risk factors for diabetes. Testing should be considered at a younger age or be carried out more frequently if you are overweight and have at least 1 risk factor for diabetes.  Breast cancer screening is essential preventative care for women. You should practice "breast self-awareness." This means understanding the normal appearance and feel of your breasts and may include breast self-examination. Any changes detected, no matter how small, should be reported to a caregiver. Women in their 31s and 30s should have a clinical breast exam (CBE) by a caregiver as part of a regular health exam every 1 to 3 years. After age 12, women should have a CBE every year. Starting at age 70, women should consider having a mammogram (breast X-ray) every year. Women who have a family history of breast cancer should talk to their caregiver about genetic screening. Women at a high risk of breast cancer should talk to their caregiver about having an MRI and a mammogram every year.  Breast cancer gene (BRCA)-related cancer risk assessment is recommended for women who have family members with BRCA-related cancers. BRCA-related cancers include breast, ovarian, tubal, and peritoneal cancers. Having family members with these cancers may be associated with an increased risk for harmful changes (mutations) in the breast cancer genes BRCA1 and BRCA2. Results of the assessment will determine the need for genetic counseling and BRCA1 and BRCA2 testing.  The Pap test is a screening test for cervical cancer. Women should have a Pap test starting at age 35. Between ages 63 and 55, Pap tests should be repeated every 2 years. Beginning at age 24, you should have a Pap test every 3 years as long as the past 3 Pap tests have been normal. If you had a hysterectomy for a  problem that was not cancer or a condition that could lead to cancer, then you no longer need Pap tests. If you are between ages 62 and 39, and you have had normal Pap tests going back 10 years, you no longer need Pap tests. If you have had past treatment for cervical cancer or a condition that could lead to cancer, you need Pap tests and screening for cancer for at least 20 years after your treatment. If Pap tests have been discontinued, risk factors (such as a new sexual partner) need to be reassessed to determine if screening should be resumed. Some women have medical problems that increase the chance of getting cervical cancer. In these cases, your caregiver may recommend more frequent screening and Pap tests.  The human papillomavirus (HPV) test is an additional test that may be used for cervical cancer screening. The HPV test looks for the virus that can cause the cell changes on the cervix. The cells collected during the Pap test can be tested for HPV. The  HPV test could be used to screen women aged 30 years and older, and should be used in women of any age who have unclear Pap test results. After the age of 63, women should have HPV testing at the same frequency as a Pap test.  Colorectal cancer can be detected and often prevented. Most routine colorectal cancer screening begins at the age of 54 and continues through age 62. However, your caregiver may recommend screening at an earlier age if you have risk factors for colon cancer. On a yearly basis, your caregiver may provide home test kits to check for hidden blood in the stool. Use of a small camera at the end of a tube, to directly examine the colon (sigmoidoscopy or colonoscopy), can detect the earliest forms of colorectal cancer. Talk to your caregiver about this at age 31, when routine screening begins. Direct examination of the colon should be repeated every 5 to 10 years through age 42, unless early forms of pre-cancerous polyps or small growths  are found.  Hepatitis C blood testing is recommended for all people born from [birth year] through 1966 and any individual with known risks for hepatitis C.  Practice safe sex. Use condoms and avoid high-risk sexual practices to reduce the spread of sexually transmitted infections (STIs). Sexually active women aged 4 and younger should be checked for Chlamydia, which is a common sexually transmitted infection. Older women with new or multiple partners should also be tested for Chlamydia. Testing for other STIs is recommended if you are sexually active and at increased risk.  Osteoporosis is a disease in which the bones lose minerals and strength with aging. This can result in serious bone fractures. The risk of osteoporosis can be identified using a bone density scan. Women ages 28 and over and women at risk for fractures or osteoporosis should discuss screening with their caregivers. Ask your caregiver whether you should be taking a calcium supplement or vitamin D to reduce the rate of osteoporosis.  Menopause can be associated with physical symptoms and risks. Hormone replacement therapy is available to decrease symptoms and risks. You should talk to your caregiver about whether hormone replacement therapy is right for you.  Use sunscreen. Apply sunscreen liberally and repeatedly throughout the day. You should seek shade when your shadow is shorter than you. Protect yourself by wearing long sleeves, pants, a wide-brimmed hat, and sunglasses year round, whenever you are outdoors.  Notify your caregiver of new moles or changes in moles, especially if there is a change in shape or color. Also notify your caregiver if a mole is larger than the size of a pencil eraser.  Stay current with your immunizations. Document Released: 09/04/2010 Document Revised: 06/16/2012 Document Reviewed: 09/04/2010 [facility] Patient Information 2014 [place].

## 2014-04-23 NOTE — Progress Notes (Signed)
Katherine Pena 04-10-1980 [id number]        33 y.o.  G2P2002 for annual exam.  Has not been in the office since 2011. Reports doing well with several issues noted below.  Past medical history,surgical history, problem list, medications, allergies, family history and social history were all reviewed and documented as reviewed in the EPIC chart.  ROS:  Performed with pertinent positives and negatives included in the history, assessment and plan.   Additional significant findings :  none   Exam: Kim Counsellor Vitals:   04/23/14 1208  BP: 124/80  Height: 5\' 6"  (1.676 m)  Weight: 187 lb (84.823 kg)   General appearance:  Normal affect, orientation and appearance. Skin: Grossly normal HEENT: Without gross lesions.  No cervical or supraclavicular adenopathy. Thyroid normal.  Lungs:  Clear without wheezing, rales or rhonchi Cardiac: RR, without RMG Abdominal:  Soft, nontender, without masses, guarding, rebound, organomegaly or hernia Breasts:  Examined lying and sitting without masses, retractions, discharge or axillary adenopathy. Pelvic:  Ext/BUS/vagina normal  Cervix normal. Pap/HPV  Uterus anteverted, normal size, shape and contour, midline and mobile nontender   Adnexa  Without masses or tenderness    Anus and perineum  Normal   Rectovaginal  Normal sphincter tone without palpated masses or tenderness.    Assessment/Plan:  34 y.o. G24P2002 female for annual exam with regular menses, condom contraception.   1. Contraception. Patient wants to discuss contraceptive options. Reviewed pill, patch, ring, Nexplanon, Depo-Provera, IUD, sterilization. Patient leaning toward sterilization. Would also consider IUD. I reviewed the Mirena IUD, risks/benefits. Insertional process discussed. Issues with sterilization to include general anesthesia and abdominal incisions/surgery reviewed. Patient wants to think of her options and will follow up with her decision. 2. 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 Last evaluated 2010.  Check prolactin today. Schedule follow up MRI as it has been 5-6 years. No history of galactorrhea headaches or visual changes. Patient knows importance of follow up for the MRI. 3. Pap smear 2013. Pap smear/HPV today. No history of significant abnormal Pap smears. 4. Breast health. SBE monthly reviewed. No strong family history of breast cancer. Plan mammogram closer to 39. 5. Health maintenance. Baseline CBC, comprehensive metabolic panel, lipid profile, urinalysis, TSH secondary to complaints of heat intolerance, prolactin ordered. Follow up with contraceptive decision, or the MRI and annually for exam.     Anastasio Auerbach MD, 12:40 PM 04/23/2014

## 2014-04-24 LAB — URINALYSIS W MICROSCOPIC + REFLEX CULTURE
CASTS: NONE SEEN
Crystals: NONE SEEN
Glucose, UA: NEGATIVE mg/dL
Nitrite: NEGATIVE
Protein, ur: NEGATIVE mg/dL
Specific Gravity, Urine: 1.028 (ref 1.005–1.030)
Urobilinogen, UA: 0.2 mg/dL (ref 0.0–1.0)
pH: 5.5 (ref 5.0–8.0)

## 2014-04-24 LAB — TSH: TSH: 1.264 u[IU]/mL (ref 0.350–4.500)

## 2014-04-24 LAB — PROLACTIN: Prolactin: 47.7 ng/mL

## 2014-04-25 LAB — URINE CULTURE

## 2014-04-26 ENCOUNTER — Telehealth: Payer: Self-pay | Admitting: Gynecology

## 2014-04-26 ENCOUNTER — Encounter: Payer: Self-pay | Admitting: Gynecology

## 2014-04-26 ENCOUNTER — Other Ambulatory Visit: Payer: Self-pay | Admitting: Gynecology

## 2014-04-26 DIAGNOSIS — R7309 Other abnormal glucose: Secondary | ICD-10-CM

## 2014-04-26 DIAGNOSIS — Z30431 Encounter for routine checking of intrauterine contraceptive device: Secondary | ICD-10-CM

## 2014-04-26 DIAGNOSIS — R3129 Other microscopic hematuria: Secondary | ICD-10-CM

## 2014-04-26 MED ORDER — LEVONORGESTREL 20 MCG/24HR IU IUD: INTRAUTERINE_SYSTEM | Freq: Once | INTRAUTERINE | Status: AC

## 2014-04-26 NOTE — Telephone Encounter (Signed)
04/26/14-Pt was advised today that her [place] BC ins covers the MIrena & insertion for contraception at 100%, no copay. Appt already made with TF for insertion.wl

## 2014-04-27 ENCOUNTER — Telehealth: Payer: Self-pay | Admitting: *Deleted

## 2014-04-27 DIAGNOSIS — E229 Hyperfunction of pituitary gland, unspecified: Principal | ICD-10-CM

## 2014-04-27 DIAGNOSIS — R7989 Other specified abnormal findings of blood chemistry: Secondary | ICD-10-CM

## 2014-04-27 LAB — CYTOLOGY - PAP

## 2014-04-27 NOTE — Telephone Encounter (Signed)
Appointment scheduled on 06/06/14 @ 5:00pm at the [place] building which is connected to [employer]. Left message for pt to call.

## 2014-04-27 NOTE — Telephone Encounter (Signed)
Error the below should say 05/07/14 @ 5:00pm

## 2014-04-27 NOTE — Telephone Encounter (Signed)
-----   Message from Sandi Mariscal, [place] sent at 04/26/2014 12:31 PM EST ----- Regarding: MRI Per Dr. Loetta Rough "#2 prolactin elevated in same range as before. Make sure she follows up for her pituitary MRI as already sent out to arrange."

## 2014-04-28 ENCOUNTER — Other Ambulatory Visit: Payer: Self-pay | Admitting: Gynecology

## 2014-04-28 MED ORDER — FLUCONAZOLE 150 MG PO TABS: 150.0000 mg | ORAL_TABLET | Freq: Once | ORAL | Status: DC

## 2014-04-28 NOTE — Telephone Encounter (Signed)
Juliann Pulse informed patient with the below note

## 2014-04-30 ENCOUNTER — Telehealth: Payer: Self-pay

## 2014-04-30 NOTE — Telephone Encounter (Signed)
I spoke with Tameka at [facility] today. Authorization received for MRI of Brain w/ w/o contrast. Authorization # is [id number] valid 2/26-3/26/16.  Note made to appt comments.

## 2014-05-07 ENCOUNTER — Ambulatory Visit (HOSPITAL_COMMUNITY): Payer: BC Managed Care – PPO

## 2014-05-07 ENCOUNTER — Ambulatory Visit (HOSPITAL_COMMUNITY)
Admission: RE | Admit: 2014-05-07 | Discharge: 2014-05-07 | Disposition: A | Discharge status: Home / Self Care | Payer: BC Managed Care – PPO | Source: Ambulatory Visit | Attending: Gynecology | Admitting: Gynecology

## 2014-05-07 DIAGNOSIS — R7989 Other specified abnormal findings of blood chemistry: Secondary | ICD-10-CM

## 2014-05-07 DIAGNOSIS — G939 Disorder of brain, unspecified: Secondary | ICD-10-CM | POA: Insufficient documentation

## 2014-05-07 DIAGNOSIS — E229 Hyperfunction of pituitary gland, unspecified: Secondary | ICD-10-CM

## 2014-05-07 MED ORDER — GADOBENATE DIMEGLUMINE 529 MG/ML IV SOLN
20.0000 mL | Freq: Once | INTRAVENOUS | Status: AC | PRN
  Administered 2014-05-07: 16 mL via INTRAVENOUS

## 2014-05-17 ENCOUNTER — Telehealth: Payer: Self-pay

## 2014-05-17 NOTE — Telephone Encounter (Signed)
Patient called to inquire if UPT had been done on urine specimen on 04/23/14.  Informed no UPT was done.

## 2014-05-18 ENCOUNTER — Ambulatory Visit: Payer: BC Managed Care – PPO | Admitting: Gynecology

## 2014-05-24 ENCOUNTER — Ambulatory Visit (INDEPENDENT_AMBULATORY_CARE_PROVIDER_SITE_OTHER): Payer: BC Managed Care – PPO | Admitting: Gynecology

## 2014-05-24 ENCOUNTER — Encounter: Payer: Self-pay | Admitting: Gynecology

## 2014-05-24 VITALS — BP 122/78

## 2014-05-24 DIAGNOSIS — Z30430 Encounter for insertion of intrauterine contraceptive device: Secondary | ICD-10-CM

## 2014-05-24 NOTE — Patient Instructions (Signed)
Intrauterine Device Insertion Most often, an intrauterine device (IUD) is inserted into the uterus to prevent pregnancy. There are 2 types of IUDs available:  Copper IUD--This type of IUD creates an environment that is not favorable to sperm survival. The mechanism of action of the copper IUD is not known for certain. It can stay in place for 10 years.  Hormone IUD--This type of IUD contains the hormone progestin (synthetic progesterone). The progestin thickens the cervical mucus and prevents sperm from entering the uterus, and it also thins the uterine lining. There is no evidence that the hormone IUD prevents implantation. One hormone IUD can stay in place for up to 5 years, and a different hormone IUD can stay in place for up to 3 years. An IUD is the most cost-effective birth control if left in place for the full duration. It may be removed at any time. LET YOUR HEALTH CARE PROVIDER KNOW ABOUT:  Any allergies you have.  All medicines you are taking, including vitamins, herbs, eye drops, creams, and over-the-counter medicines.  Previous problems you or members of your family have had with the use of anesthetics.  Any blood disorders you have.  Previous surgeries you have had.  Possibility of pregnancy.  Medical conditions you have. RISKS AND COMPLICATIONS  Generally, intrauterine device insertion is a safe procedure. However, as with any procedure, complications can occur. Possible complications include:  Accidental puncture (perforation) of the uterus.  Accidental placement of the IUD either in the muscle layer of the uterus (myometrium) or outside the uterus. If this happens, the IUD can be found essentially floating around the bowels and must be taken out surgically.  The IUD may fall out of the uterus (expulsion). This is more common in women who have recently had a child.   Pregnancy in the fallopian tube (ectopic).  Pelvic inflammatory disease (PID), which is infection of  the uterus and fallopian tubes. The risk of PID is slightly increased in the first 20 days after the IUD is placed, but the overall risk is still very low. BEFORE THE PROCEDURE  Schedule the IUD insertion for when you will have your menstrual period or right after, to make sure you are not pregnant. Placement of the IUD is better tolerated shortly after a menstrual cycle.  You may need to take tests or be examined to make sure you are not pregnant.  You may be required to take a pregnancy test.  You may be required to get checked for sexually transmitted infections (STIs) prior to placement. Placing an IUD in someone who has an infection can make the infection worse.  You may be given a pain reliever to take 1 or 2 hours before the procedure.  An exam will be performed to determine the size and position of your uterus.  Ask your health care provider about changing or stopping your regular medicines. PROCEDURE   A tool (speculum) is placed in the vagina. This allows your health care provider to see the lower part of the uterus (cervix).  The cervix is prepped with a medicine that lowers the risk of infection.  You may be given a medicine to numb each side of the cervix (intracervical or paracervical block). This is used to block and control any discomfort with insertion.  A tool (uterine sound) is inserted into the uterus to determine the length of the uterine cavity and the direction the uterus may be tilted.  A slim instrument (IUD inserter) is inserted through the cervical   canal and into your uterus.  The IUD is placed in the uterine cavity and the insertion device is removed.  The nylon string that is attached to the IUD and used for eventual IUD removal is trimmed. It is trimmed so that it lays high in the vagina, just outside the cervix. AFTER THE PROCEDURE  You may have bleeding after the procedure. This is normal. It varies from light spotting for a few days to menstrual-like  bleeding.  You may have mild cramping. Document Released: 10/18/2010 Document Revised: 12/10/2012 Document Reviewed: 08/10/2012 ExitCare Patient Information 2015 [employer]. This information is not intended to replace advice given to you by your health care provider. Make sure you discuss any questions you have with your health care provider.  

## 2014-05-24 NOTE — Progress Notes (Signed)
Patient presents for Mirena IUD placement. She has read through the booklet, has no contraindications and signed the consent form. She currently is on a normal menses.  I reviewed the insertional process with her as well as the risks to include infection, either immediate or long-term, uterine perforation or migration requiring surgery to remove, other complications such as pain, hormonal side effects, infertility and possibility of failure with subsequent pregnancy.   Exam with Kim assistant Pelvic: External BUS vagina normal. Cervix normal with light menses flow. Uterus anteverted normal size shape contour midline mobile nontender. Adnexa without masses or tenderness.  Procedure: The cervix was cleansed with Betadine, anterior lip grasped with a single-tooth tenaculum, the uterus was sounded and a Mirena IUD was placed according to manufacturer's recommendations without difficulty. The strings were trimmed. The patient tolerated well and will follow up in one month for a postinsertional check.  Lot number:  [id number]    Anastasio Auerbach MD, 11:39 AM 05/24/2014

## 2014-05-27 ENCOUNTER — Telehealth: Payer: Self-pay | Admitting: *Deleted

## 2014-05-27 NOTE — Telephone Encounter (Signed)
Pt called with question regarding MOnday's apt for Mirena. I LM on VM to call back KW CMA

## 2014-06-02 ENCOUNTER — Ambulatory Visit: Payer: BC Managed Care – PPO | Admitting: Gynecology

## 2014-06-07 NOTE — Telephone Encounter (Signed)
Pt never called back. KW CMA 

## 2014-06-21 ENCOUNTER — Ambulatory Visit: Payer: BC Managed Care – PPO | Admitting: Gynecology

## 2014-06-29 ENCOUNTER — Ambulatory Visit (INDEPENDENT_AMBULATORY_CARE_PROVIDER_SITE_OTHER): Payer: BC Managed Care – PPO | Admitting: Gynecology

## 2014-06-29 ENCOUNTER — Encounter: Payer: Self-pay | Admitting: Gynecology

## 2014-06-29 VITALS — BP 120/76

## 2014-06-29 DIAGNOSIS — Z113 Encounter for screening for infections with a predominantly sexual mode of transmission: Secondary | ICD-10-CM

## 2014-06-29 DIAGNOSIS — L298 Other pruritus: Secondary | ICD-10-CM | POA: Diagnosis not present

## 2014-06-29 DIAGNOSIS — Z30431 Encounter for routine checking of intrauterine contraceptive device: Secondary | ICD-10-CM | POA: Diagnosis not present

## 2014-06-29 DIAGNOSIS — N898 Other specified noninflammatory disorders of vagina: Secondary | ICD-10-CM

## 2014-06-29 MED ORDER — FLUCONAZOLE 150 MG PO TABS: 150.0000 mg | ORAL_TABLET | Freq: Once | ORAL | Status: DC

## 2014-06-29 NOTE — Progress Notes (Signed)
SAFIRA PROFFIT 05-14-1980 [id number]        34 y.o.  [id number] Presents for follow up IUD check. Has continued have some spotting on and off on a daily basis but she feels like it is gradually getting better. She also asked about STD screening. No known exposure but just wants to be screened. Lastly she notes some vaginal itching without significant discharge. No urinary symptoms such as frequency dysuria or urgency.  Past medical history,surgical history, problem list, medications, allergies, family history and social history were all reviewed and documented in the EPIC chart.  Directed ROS with pertinent positives and negatives documented in the history of present illness/assessment and plan.  Exam: Kim assistant Filed Vitals:   06/29/14 1535  BP: 120/76   General appearance:  Normal Abdomen soft nontender without masses guarding rebound Pelvic external BUS vagina with blood staining. Cervix normal with IUD string visualized. GC/Chlamydia screen done. Uterus normal size midline mobile nontender. Adnexa without masses or tenderness.  Assessment/Plan:  34 y.o. [id number] with normal IUD check. She still is spotting but this seems to be getting better and she will monitor. As long as it resolves then she will follow. If continues she will call.  Patient requests STD screening. GC/Chlamydia, HIV, hepatitis B, hepatitis C and RPR done. HSV/HPV were also discussed. Patient will follow up for her annual February 2017 assuming she continues well, sooner as needed.    Anastasio Auerbach MD, 4:14 PM 06/29/2014

## 2014-06-29 NOTE — Patient Instructions (Signed)
Follow up with the irregular bleeding continues.   Take the one Diflucan pill in follow up if this does not help with the vaginal itching.

## 2014-06-30 ENCOUNTER — Telehealth: Payer: Self-pay | Admitting: *Deleted

## 2014-06-30 LAB — HEPATITIS B SURFACE ANTIGEN: Hepatitis B Surface Ag: NEGATIVE

## 2014-06-30 LAB — HEPATITIS C ANTIBODY: HCV Ab: NEGATIVE

## 2014-06-30 LAB — GC/CHLAMYDIA PROBE AMP
CT Probe RNA: NEGATIVE
GC Probe RNA: NEGATIVE

## 2014-06-30 LAB — RPR

## 2014-06-30 LAB — HIV ANTIBODY (ROUTINE TESTING W REFLEX): HIV: NONREACTIVE

## 2014-06-30 NOTE — Telephone Encounter (Signed)
Pt called requesting STD panel pt informed panel negative

## 2014-08-16 ENCOUNTER — Ambulatory Visit (INDEPENDENT_AMBULATORY_CARE_PROVIDER_SITE_OTHER): Payer: BC Managed Care – PPO | Admitting: Gynecology

## 2014-08-16 ENCOUNTER — Encounter: Payer: Self-pay | Admitting: Gynecology

## 2014-08-16 VITALS — BP 124/74

## 2014-08-16 DIAGNOSIS — N898 Other specified noninflammatory disorders of vagina: Secondary | ICD-10-CM | POA: Diagnosis not present

## 2014-08-16 DIAGNOSIS — N766 Ulceration of vulva: Secondary | ICD-10-CM | POA: Diagnosis not present

## 2014-08-16 LAB — WET PREP FOR TRICH, YEAST, CLUE
TRICH WET PREP: NONE SEEN
YEAST WET PREP: NONE SEEN

## 2014-08-16 MED ORDER — VALACYCLOVIR HCL 500 MG PO TABS: 500.0000 mg | ORAL_TABLET | Freq: Two times a day (BID) | ORAL | Status: DC

## 2014-08-16 NOTE — Patient Instructions (Signed)
Start the Valtrex medicine twice daily for 5 days and once daily.  Office will call you with the culture results

## 2014-08-16 NOTE — Progress Notes (Addendum)
Katherine Pena 1981-02-25 [id number]        34 y.o.  [id number] Presents complaining of recurrent vulvar ulcers. Notes for 2 months now she's had a recurrence of ulcers in the periclitoral region, same area both months. Lasted for 1 week and then resolved. No groin discomfort or generalized viral type symptoms. Currently in a stable relationship with no reported genital HSV from her partner although he does have "cold sores" also notes a slight vaginal discharge but she thinks this is just from the IUD placement and seems to be getting better.  Past medical history,surgical history, problem list, medications, allergies, family history and social history were all reviewed and documented in the EPIC chart.  Directed ROS with pertinent positives and negatives documented in the history of present illness/assessment and plan.  Exam: Kim assistant Filed Vitals:   08/16/14 1105  BP: 124/74   General appearance:  Normal External BUS vagina with isolated punctated ulcer left periclitoral region as diagrammed. No inguinal adenopathy. Vagina without significant discharge. Cervix normal with IUD string visualized. Bimanual with normal uterine size nontender midline mobile. Adnexa without masses or tenderness Physical Exam  Genitourinary:        Assessment/Plan:  34 y.o. M1D6222 with recurrent punctated ulcer. Highly suspicious for HSV. Patient had already suspected this from reading on the Internet. HSV culture taken. Reviewed with her negative culture does not mean that she does not have this but positive does. Issues/pitfalls of doing HSV antibodies discussed. Will start Valtrex 500 mg twice a day 5 days then once daily. We'll continue this regardless of culture results over the next several months given the strong history and physical appearance suggestive of HSV. If remains without recurrent outbreaks will continue acyclovir for 6 months and then stop. Issues of transmission to partners reviewed.  Valtrex does not guarantee she would not transmit this but may decrease transmission risk. Patient will follow up for culture results.  Patient was noticing a slight discharge since the IUD insertion which is getting better. Her exam is normal and wet prep is negative, will just follow for now. Will follow up if it persists.    Anastasio Auerbach MD, 11:34 AM 08/16/2014

## 2014-08-18 ENCOUNTER — Encounter: Payer: Self-pay | Admitting: Gynecology

## 2014-08-18 LAB — HERPES SIMPLEX VIRUS CULTURE: Organism ID, Bacteria: DETECTED

## 2015-08-30 ENCOUNTER — Other Ambulatory Visit: Payer: Self-pay | Admitting: Gynecology

## 2015-10-18 ENCOUNTER — Encounter: Payer: BC Managed Care – PPO | Admitting: Gynecology

## 2016-02-03 HISTORY — PX: IUD REMOVAL: SHX5392

## 2016-02-23 ENCOUNTER — Encounter: Payer: Self-pay | Admitting: Gynecology

## 2016-02-23 ENCOUNTER — Ambulatory Visit (INDEPENDENT_AMBULATORY_CARE_PROVIDER_SITE_OTHER): Payer: BC Managed Care – PPO | Admitting: Gynecology

## 2016-02-23 VITALS — BP 118/74 | Ht 66.0 in | Wt 198.0 lb

## 2016-02-23 DIAGNOSIS — Z30432 Encounter for removal of intrauterine contraceptive device: Secondary | ICD-10-CM | POA: Diagnosis not present

## 2016-02-23 DIAGNOSIS — Z01419 Encounter for gynecological examination (general) (routine) without abnormal findings: Secondary | ICD-10-CM

## 2016-02-23 DIAGNOSIS — D352 Benign neoplasm of pituitary gland: Secondary | ICD-10-CM

## 2016-02-23 DIAGNOSIS — A609 Anogenital herpesviral infection, unspecified: Secondary | ICD-10-CM | POA: Diagnosis not present

## 2016-02-23 MED ORDER — VALACYCLOVIR HCL 500 MG PO TABS: ORAL_TABLET | ORAL | 11 refills | Status: DC

## 2016-02-23 MED ORDER — NORETHINDRONE ACET-ETHINYL EST 1-20 MG-MCG PO TABS: 1.0000 | ORAL_TABLET | Freq: Every day | ORAL | 11 refills | Status: DC

## 2016-02-23 NOTE — Progress Notes (Signed)
    Katherine Pena 1980-11-26 [id number]        35 y.o.  G2P2002 for annual exam.  Also would like to have her Mirena IUD removed. She is having some issues with hair loss and acne. Also some headaches. Was wondering whether is related to the IUD. Has been seen by her primary physician in reference to this. Would like to start the birth control pills at least for now.  Past medical history,surgical history, problem list, medications, allergies, family history and social history were all reviewed and documented as reviewed in the EPIC chart.  ROS:  Performed with pertinent positives and negatives included in the history, assessment and plan.   Additional significant findings :  None   Exam: Caryn Bee assistant Vitals:   02/23/16 0934  BP: 118/74  Weight: 198 lb (89.8 kg)  Height: 5\' 6"  (1.676 m)   Body mass index is 31.96 kg/m.  General appearance:  Normal affect, orientation and appearance. Skin: Grossly normal. No significant alopecia noted HEENT: Without gross lesions.  No cervical or supraclavicular adenopathy. Thyroid normal.  Lungs:  Clear without wheezing, rales or rhonchi Cardiac: RR, without RMG Abdominal:  Soft, nontender, without masses, guarding, rebound, organomegaly or hernia Breasts:  Examined lying and sitting without masses, retractions, discharge or axillary adenopathy. Pelvic:  Ext, BUS, Vagina normal  Cervix normal with IUD string visualized  Uterus anteverted, normal size, shape and contour, midline and mobile nontender   Adnexa without masses or tenderness    Anus and perineum normal   Rectovaginal normal sphincter tone without palpated masses or tenderness.   Procedure: The IUD string was grasped with the [facility] forcep, the IUD removed, shown to the patient and discarded.   Assessment/Plan:  35 y.o. G72P2002 female for annual exam without menses, Mirena IUD.   1. Mirena IUD removed as above at patient request. Will start on Loestrin 1/20 equivalent.  Proper usage with backup contraception first pack discussed. Refill 1 year provided. Risks to include stroke heart attack DVT reviewed. Not being followed for medical issues or never smoked. 2. History of 6 mm pituitary microadenoma on MRI 2010. Follow up MRI last year did not show evidence of obvious microadenoma.  Prolactin 47 last year which is where it normally runs. Check prolactin now. No history such as headaches or visual changes. Continue observation for now. We'll plan repeat MRI sometime in the future. 3. Genital HSV diagnoses 2016. Had started on Valtrex but discontinued and is not having any outbreaks at this point. Would like prescription in the event she would develop outbreaks. Valtrex 500 mg #30 refill 11 provided. 4. Pap smear/HPV 2016 negative. No Pap smear done today. No history of significant abnormal Pap smears. 5. Breast health. SBE monthly reviewed. No strong family history of breast cancer by her history. We'll initiate baseline mammography at age 2. 68. Health maintenance. Patient relates having her routine blood work done at her primary physician's office. Follow up 1 year, sooner as needed.   Anastasio Auerbach MD, 9:59 AM 02/23/2016

## 2016-02-23 NOTE — Patient Instructions (Signed)
Start on the birth control pills as we discussed. Follow up if you have any issues with this.

## 2016-02-24 LAB — PROLACTIN: Prolactin: 43.9 ng/mL — ABNORMAL HIGH

## 2018-11-25 ENCOUNTER — Encounter: Payer: Self-pay | Admitting: Gynecology

## 2019-05-09 ENCOUNTER — Ambulatory Visit: Payer: BC Managed Care – PPO | Attending: Internal Medicine

## 2019-05-09 DIAGNOSIS — Z23 Encounter for immunization: Secondary | ICD-10-CM

## 2019-05-09 NOTE — Progress Notes (Signed)
   Covid-19 Vaccination Clinic  Name:  Katherine Pena    MRN: [id number] DOB: 1980/09/17  05/09/2019  Katherine Pena was observed post Covid-19 immunization for 15 minutes without incident. She was provided with Vaccine Information Sheet and instruction to access the V-Safe system.   Katherine Pena was instructed to call 911 with any severe reactions post vaccine: Marland Kitchen Difficulty breathing  . Swelling of face and throat  . A fast heartbeat  . A bad rash all over body  . Dizziness and weakness   Immunizations Administered    Name Date Dose VIS Date Route   Pfizer COVID-19 Vaccine 05/09/2019  8:33 AM 0.3 mL 02/13/2019 Intramuscular   Manufacturer: [place]   Lot: [id number]   [place]: [id number]

## 2019-06-06 ENCOUNTER — Ambulatory Visit: Payer: BC Managed Care – PPO | Attending: Internal Medicine

## 2019-06-06 DIAGNOSIS — Z23 Encounter for immunization: Secondary | ICD-10-CM

## 2019-06-06 NOTE — Progress Notes (Signed)
   Covid-19 Vaccination Clinic  Name:  Katherine Pena    MRN: [id number] DOB: 10/10/1980  06/06/2019  [name] was observed post Covid-19 immunization for 15 minutes without incident. She was provided with Vaccine Information Sheet and instruction to access the V-Safe system.   [name] was instructed to call 911 with any severe reactions post vaccine: Marland Kitchen Difficulty breathing  . Swelling of face and throat  . A fast heartbeat  . A bad rash all over body  . Dizziness and weakness   Immunizations Administered    Name Date Dose VIS Date Route   Pfizer COVID-19 Vaccine 06/06/2019  8:32 AM 0.3 mL 02/13/2019 Intramuscular   Manufacturer: [place]   Lot: [id number]   [place]: [id number]

## 2019-06-10 ENCOUNTER — Other Ambulatory Visit: Payer: Self-pay

## 2019-06-11 ENCOUNTER — Ambulatory Visit: Payer: BC Managed Care – PPO | Admitting: Nurse Practitioner

## 2019-06-25 ENCOUNTER — Ambulatory Visit: Payer: BC Managed Care – PPO | Admitting: Nurse Practitioner

## 2019-10-02 ENCOUNTER — Ambulatory Visit: Payer: BC Managed Care – PPO | Admitting: Podiatry

## 2019-10-02 ENCOUNTER — Encounter: Payer: Self-pay | Admitting: Podiatry

## 2019-10-02 ENCOUNTER — Other Ambulatory Visit: Payer: Self-pay

## 2019-10-02 DIAGNOSIS — B351 Tinea unguium: Secondary | ICD-10-CM | POA: Diagnosis not present

## 2019-10-02 MED ORDER — TERBINAFINE HCL 250 MG PO TABS: 250.0000 mg | ORAL_TABLET | Freq: Every day | ORAL | 0 refills | Status: DC

## 2019-10-05 ENCOUNTER — Telehealth: Payer: Self-pay | Admitting: *Deleted

## 2019-10-05 MED ORDER — TERBINAFINE HCL 250 MG PO TABS: 250.0000 mg | ORAL_TABLET | Freq: Every day | ORAL | 0 refills | Status: AC

## 2019-10-05 NOTE — Telephone Encounter (Signed)
Pt states her prescription from Friday has not been sent to the pharmacy, would like it sent to the [facility].

## 2019-10-05 NOTE — Telephone Encounter (Signed)
I spoke with pt and asked which pharmacy on [address] and she stated [facility].

## 2019-10-28 NOTE — Progress Notes (Signed)
Subjective:   Patient ID: Katherine Pena, female   DOB: 39 y.o.   MRN: [id number]   HPI Patient presents stating she has had nail disease for a long time in her toes and they get thick and also she gets dry skin.  States that she has tried to soak them and use topical medicines and trim without any real improvement   Review of Systems  All other systems reviewed and are negative.       Objective:  Physical Exam Vitals and nursing note reviewed.  Constitutional:      Appearance: She is well-developed.  Pulmonary:     Effort: Pulmonary effort is normal.  Musculoskeletal:        General: Normal range of motion.  Skin:    General: Skin is warm.  Neurological:     Mental Status: She is alert.     Neurovascular status intact muscle strength found to be adequate with patient noted to have thick yellow brittle nailbeds 1-5 both feet that are dystrophic and there is itch component.  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  She has just had blood work with normal liver function and I did discuss with her treatment options and she is opted for an aggressive conservative approach.  We will place her on oral antifungal therapy at this time and I educated her on it and risk and she wants to do this and will do 90 days of oral Lamisil.  Patient will be seen back to recheck again and is encouraged to call with questions concerns which may arise

## 2022-11-21 ENCOUNTER — Encounter (HOSPITAL_COMMUNITY): Payer: Self-pay | Admitting: Emergency Medicine

## 2022-11-21 ENCOUNTER — Other Ambulatory Visit: Payer: Self-pay

## 2022-11-21 ENCOUNTER — Emergency Department (HOSPITAL_COMMUNITY)
Admission: EM | Admit: 2022-11-21 | Discharge: 2022-11-21 | Discharge status: Against Medical Advice | Payer: Worker's Compensation | Attending: Emergency Medicine | Admitting: Emergency Medicine

## 2022-11-21 ENCOUNTER — Emergency Department (HOSPITAL_COMMUNITY): Payer: Worker's Compensation

## 2022-11-21 DIAGNOSIS — R519 Headache, unspecified: Secondary | ICD-10-CM | POA: Insufficient documentation

## 2022-11-21 DIAGNOSIS — Z5321 Procedure and treatment not carried out due to patient leaving prior to being seen by health care provider: Secondary | ICD-10-CM | POA: Insufficient documentation

## 2022-11-21 DIAGNOSIS — Y92410 Unspecified street and highway as the place of occurrence of the external cause: Secondary | ICD-10-CM | POA: Diagnosis not present

## 2022-11-21 DIAGNOSIS — M542 Cervicalgia: Secondary | ICD-10-CM | POA: Insufficient documentation

## 2022-11-21 NOTE — ED Notes (Signed)
Pt states they are leaving  
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
## 2022-11-22 ENCOUNTER — Encounter (HOSPITAL_COMMUNITY): Payer: Self-pay | Admitting: *Deleted

## 2022-11-22 ENCOUNTER — Ambulatory Visit (HOSPITAL_COMMUNITY)
Admission: EM | Admit: 2022-11-22 | Discharge: 2022-11-22 | Disposition: A | Discharge status: Home / Self Care | Payer: BC Managed Care – PPO | Attending: Student | Admitting: Student

## 2022-11-22 DIAGNOSIS — S161XXD Strain of muscle, fascia and tendon at neck level, subsequent encounter: Secondary | ICD-10-CM

## 2022-11-22 MED ORDER — IBUPROFEN 600 MG PO TABS: 600.0000 mg | ORAL_TABLET | Freq: Three times a day (TID) | ORAL | 0 refills | Status: AC | PRN
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
## 2022-11-22 NOTE — Discharge Instructions (Addendum)
Your cervical spine imaging was unremarkable.  I have sent in Motrin 600 mg every 8 hours as needed.  You can alternate this with Tylenol 1 g every 6 hours as needed.  I recommend heat, you should avoid ice.  The muscle tightness may persist for the next few weeks.  If it persist longer than that, follow-up with your PCP for consideration of physical therapy.

## 2023-06-12 ENCOUNTER — Other Ambulatory Visit: Payer: Self-pay | Admitting: Family Medicine

## 2023-06-12 DIAGNOSIS — Z1231 Encounter for screening mammogram for malignant neoplasm of breast: Secondary | ICD-10-CM
